# Patient Record
Sex: Female | Born: 1950 | Race: White | Hispanic: No | Marital: Married | Smoking: Never smoker
Health system: Southern US, Community
[De-identification: ages and names within clinical notes are randomized; demographics above are authoritative.]

## PROBLEM LIST (undated history)

## (undated) DIAGNOSIS — M8580 Other specified disorders of bone density and structure, unspecified site: Secondary | ICD-10-CM

## (undated) DIAGNOSIS — B009 Herpesviral infection, unspecified: Secondary | ICD-10-CM

## (undated) DIAGNOSIS — N952 Postmenopausal atrophic vaginitis: Secondary | ICD-10-CM

## (undated) HISTORY — DX: Other specified disorders of bone density and structure, unspecified site: M85.80

## (undated) HISTORY — DX: Postmenopausal atrophic vaginitis: N95.2

## (undated) HISTORY — DX: Herpesviral infection, unspecified: B00.9

---

## 1997-10-17 ENCOUNTER — Other Ambulatory Visit: Admission: RE | Admit: 1997-10-17 | Discharge: 1997-10-17 | Payer: Self-pay | Admitting: Obstetrics and Gynecology

## 1998-06-14 ENCOUNTER — Other Ambulatory Visit: Admission: RE | Admit: 1998-06-14 | Discharge: 1998-06-14 | Payer: Self-pay | Admitting: Obstetrics and Gynecology

## 1999-06-08 ENCOUNTER — Other Ambulatory Visit: Admission: RE | Admit: 1999-06-08 | Discharge: 1999-06-08 | Payer: Self-pay | Admitting: Obstetrics and Gynecology

## 1999-08-30 ENCOUNTER — Other Ambulatory Visit: Admission: RE | Admit: 1999-08-30 | Discharge: 1999-08-30 | Payer: Self-pay | Admitting: Obstetrics and Gynecology

## 1999-08-30 ENCOUNTER — Encounter (INDEPENDENT_AMBULATORY_CARE_PROVIDER_SITE_OTHER): Payer: Self-pay | Admitting: Specialist

## 2000-10-21 ENCOUNTER — Other Ambulatory Visit: Admission: RE | Admit: 2000-10-21 | Discharge: 2000-10-21 | Payer: Self-pay | Admitting: Obstetrics and Gynecology

## 2001-07-07 ENCOUNTER — Ambulatory Visit (HOSPITAL_COMMUNITY): Admission: RE | Admit: 2001-07-07 | Discharge: 2001-07-07 | Payer: Self-pay | Admitting: Gastroenterology

## 2001-11-26 ENCOUNTER — Other Ambulatory Visit: Admission: RE | Admit: 2001-11-26 | Discharge: 2001-11-26 | Payer: Self-pay | Admitting: Obstetrics and Gynecology

## 2002-12-03 ENCOUNTER — Other Ambulatory Visit: Admission: RE | Admit: 2002-12-03 | Discharge: 2002-12-03 | Payer: Self-pay | Admitting: Obstetrics and Gynecology

## 2003-12-05 ENCOUNTER — Other Ambulatory Visit: Admission: RE | Admit: 2003-12-05 | Discharge: 2003-12-05 | Payer: Self-pay | Admitting: Obstetrics and Gynecology

## 2005-09-03 ENCOUNTER — Other Ambulatory Visit: Admission: RE | Admit: 2005-09-03 | Discharge: 2005-09-03 | Payer: Self-pay | Admitting: Obstetrics and Gynecology

## 2005-11-29 ENCOUNTER — Emergency Department (HOSPITAL_COMMUNITY): Admission: EM | Admit: 2005-11-29 | Discharge: 2005-11-29 | Payer: Self-pay | Admitting: Emergency Medicine

## 2006-12-30 ENCOUNTER — Encounter: Admission: RE | Admit: 2006-12-30 | Discharge: 2006-12-30 | Payer: Self-pay | Admitting: Internal Medicine

## 2008-05-25 ENCOUNTER — Encounter: Admission: RE | Admit: 2008-05-25 | Discharge: 2008-05-25 | Payer: Self-pay | Admitting: Obstetrics and Gynecology

## 2009-05-31 ENCOUNTER — Encounter: Admission: RE | Admit: 2009-05-31 | Discharge: 2009-05-31 | Payer: Self-pay | Admitting: Internal Medicine

## 2009-08-24 ENCOUNTER — Encounter: Payer: Self-pay | Admitting: Internal Medicine

## 2009-08-30 ENCOUNTER — Encounter: Payer: Self-pay | Admitting: Internal Medicine

## 2009-09-01 ENCOUNTER — Encounter: Payer: Self-pay | Admitting: Internal Medicine

## 2009-09-18 DIAGNOSIS — Q211 Atrial septal defect: Secondary | ICD-10-CM | POA: Insufficient documentation

## 2009-09-18 DIAGNOSIS — I4949 Other premature depolarization: Secondary | ICD-10-CM | POA: Insufficient documentation

## 2009-09-21 ENCOUNTER — Telehealth: Payer: Self-pay | Admitting: Internal Medicine

## 2009-09-21 ENCOUNTER — Ambulatory Visit: Payer: Self-pay | Admitting: Internal Medicine

## 2010-07-03 ENCOUNTER — Telehealth (INDEPENDENT_AMBULATORY_CARE_PROVIDER_SITE_OTHER): Payer: Self-pay | Admitting: *Deleted

## 2010-08-30 NOTE — Assessment & Plan Note (Signed)
Summary: np6/ palps,pvc  / ? insurance. 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 Referred by Dr. Timothy Lasso for further evaluation of atrial septal aneurysm.  Going through menopause. Under a lot of stress severeal months ago  due to son with bipolar disorder. Began to have tachypalpitations. Started taking 5HTP (serotonin containing) and made palpitations much worse. Stopped and palpitations of much better. Does not drink caffeine.  Recent EKG showed PVCs. ECHO at [facility] normal LV function with atrial septal aneurysm. Thyroid test pending.  Very active. Exercising regularly without symptoms. No syncope or neuro deficits.  Preventive Screening-Counseling & Management  Alcohol-Tobacco     Smoking Status: never  Caffeine-Diet-Exercise     Does Patient Exercise: yes  Current Medications (verified): 1)  Multivitamins   Tabs (Multiple Vitamin) .Marland Kitchen.. 1 Table 3 Tims Per Day 2)  Hawthorn Berry .Marland Kitchen.. 1 Tab Two Times A Day 3)  Fish Oil   Oil (Fish Oil) .Marland Kitchen.. 1 Capsule Two Times A Day 4)  Magnesium 200 Mg Tabs (Magnesium) .... 2-3 Times Per Day 5)  Calcium .... 2 Tablets Three Times A Day 6)  Mk-7 .... Once Daily 7)  Aspirin 81 Mg Tbec (Aspirin) .... Take One Tablet By Mouth Daily  Allergies (verified): No Known Drug Allergies  Past History:  Past Medical History: 1. Atrial septal aneurysm 2. PVCs    Family History: Reviewed history and no changes required. no family history of premature CAD  Social History: Reviewed history and no changes required. Married  Tobacco Use - No.  Regular Exercise - yes Smoking Status:  never Does Patient Exercise:  yes  Review of Systems       As per HPI and past medical history; otherwise all systems negative.   Vital Signs:  Patient profile:   60 year old female Height:      63 inches Weight:       134 pounds BMI:     23.82 Pulse rate:   69 / minute BP sitting:   126 / 66  (left arm)  Vitals Entered By: Laurance Flatten CMA (September 21, 2009 8:30 AM)  Physical Exam  General:  Gen: thin well appearing. no resp difficulty. mild anxious HEENT: normal Neck: supple. no JVD. Carotids 2+ bilat; no bruits. No lymphadenopathy or thryomegaly appreciated. Cor: PMI nondisplaced. Regular rate & rhythm. No rubs, gallops, murmur. Lungs: clear Abdomen: soft, nontender, nondistended. Good bowel sounds. Extremities: no cyanosis, clubbing, rash, edema Neuro: alert & orientedx3, cranial nerves grossly intact. moves all 4 extremities w/o difficulty. affect pleasant    Impression & Recommendations:  Problem # 1:  Atrial septal aneurysm Reassured her that this is benign congenital condition and likely associated with PFO which is seen in about 30% of the population. Explained that there is a question of an association with embolic events and migraines but data was controversial and not strong to support this. Did discuss the possibility of repeating echo with bubble study to further evalaute but as it will not change management she did not feel this was necessary.  Problem # 2:  PREMATURE VENTRICULAR CONTRACTIONS (ICD-427.69) Reassured her that these were benign and likely associated with stress and her previous supplement. Continue to avoid caffeine. No further work-up at ths point.  Patient Instructions: 1)  Your physician recommends that you schedule a follow-up appointment in: 12 months

## 2010-08-30 NOTE — Progress Notes (Signed)
Summary: Records Request  Faxed OV, EKG & Echo to Jodie at [facility] ([phone]). Debby Freiberg  July 03, 2010 4:16 PM

## 2010-08-30 NOTE — Progress Notes (Signed)
Summary: want to saying it's okay to get massage  Phone Note Call from Patient Call back at Home Phone [phone]   Caller: Patient Summary of Call: Pt need a note so that she can get a massage. can be fax to [phone] Initial call taken by: Judie Grieve,  September 21, 2009 1:53 PM  Follow-up for Phone Call        left mess for pt that note was faxed Meredith Staggers, RN  September 21, 2009 3:34 PM

## 2010-08-30 NOTE — Letter (Signed)
Summary: [facility]   Imported By: Marylou Mccoy 11/10/2009 16:15:32  _____________________________________________________________________  External Attachment:    Type:   Image     Comment:   External Document

## 2010-08-30 NOTE — Letter (Signed)
Summary: [facility]   Imported By: Marylou Mccoy 11/10/2009 16:26:08  _____________________________________________________________________  External Attachment:    Type:   Image     Comment:   External Document

## 2010-08-30 NOTE — Letter (Signed)
Summary: Generic Letter  Architectural technologist, Main Office  [address]   [place], [place] [address]   Phone: [phone]  Fax: [phone]        September 21, 2009 MRN: [id number]    Rebecca Winters [address] [place], [place]  [address]    To Whom It May Concern:  Mrs Albany is cleared from a cardiac viewpoint to get a massage.  If you have any questions please give me a call.    Sincerely,       Arvilla Meres, MD  This letter has been electronically signed by your physician.

## 2011-01-09 ENCOUNTER — Other Ambulatory Visit: Payer: Self-pay | Admitting: Internal Medicine

## 2011-01-09 DIAGNOSIS — N281 Cyst of kidney, acquired: Secondary | ICD-10-CM

## 2011-01-16 ENCOUNTER — Other Ambulatory Visit: Payer: Self-pay

## 2011-11-26 ENCOUNTER — Encounter: Payer: Self-pay | Admitting: Obstetrics and Gynecology

## 2011-11-26 ENCOUNTER — Ambulatory Visit (INDEPENDENT_AMBULATORY_CARE_PROVIDER_SITE_OTHER): Payer: BC Managed Care – PPO | Admitting: Obstetrics and Gynecology

## 2011-11-26 VITALS — BP 100/70 | HR 74 | Ht 63.5 in

## 2011-11-26 DIAGNOSIS — R109 Unspecified abdominal pain: Secondary | ICD-10-CM

## 2011-11-26 DIAGNOSIS — R141 Gas pain: Secondary | ICD-10-CM

## 2011-11-26 DIAGNOSIS — R3915 Urgency of urination: Secondary | ICD-10-CM

## 2011-11-26 DIAGNOSIS — Z124 Encounter for screening for malignant neoplasm of cervix: Secondary | ICD-10-CM

## 2011-11-26 DIAGNOSIS — R140 Abdominal distension (gaseous): Secondary | ICD-10-CM

## 2011-11-26 DIAGNOSIS — Z01419 Encounter for gynecological examination (general) (routine) without abnormal findings: Secondary | ICD-10-CM

## 2011-11-26 NOTE — Progress Notes (Signed)
Regular Periods: no Mammogram: yes   Monthly Breast Ex.: no Exercise: yes qd  Tetanus < 10 years: yes Seatbelts: no  NI. Bladder Functn.: yes Abuse at home: no  Daily BM's: yes Stressful Work: no  Healthy Diet: yes Sigmoid-Colonoscopy: 2012 wnl  Calcium: yes Medical problems this year: pt c/o bowel not working normal, c/o urgency; pt states bladder not working like it use to. C/o gas   LAST PAP:05/02/10 nl  Contraception:pm Mammogram:  10/03/10 nl PCP:Dr. Creola Corn [facility]   PMH: no change  FMH:no change  BONE SCAN WITHIN 5 YEARS; OSTEOPENIA

## 2011-11-26 NOTE — Patient Instructions (Addendum)
Schedule follow up after pelvic ultrasound with Dr. Estanislado Pandy.

## 2011-11-26 NOTE — Progress Notes (Signed)
Subjective:    Rebecca Winters is a 61 y.o. female G2P0011 who presents for annual exam c/o increased bloating x 1 month with loose stools, urinary urgency and a sensation in the middle lower pelvic area ( constant).  Doesn't know of any aggravating or alleviating factors. Nocturia x 3.  Increased flatulence. Patient take numerous supplements to include OTC Progesterone.  The patient  History  Sexual Activity  . Sexually Active: Yes -- Female partner(s)  . Birth Control/ Protection: Post-menopausal  . The patient is taking hormone replacement therapy and is taking a Calcium supplement. Patient denies post-menopausal vaginal bleeding..  Last Pap: was normal  2011 Last mammogram: was normal  2012 Last DEXA scan : osteopenia Colonoscopy: normal 06/2011   Review of Systems Pertinent items are noted in HPI. Gastrointestinal: increased flatulence,   no abdominal pain, no rectal bleeding Genitourinary:negative for dysuria, frequency, hematuria, nocturia and urinary incontinence  but c/o urgency.   Objective:     BP 100/70  Pulse 74  Ht 5' 3.5" (1.613 m) Weight:  Wt Readings from Last 1 Encounters:  09/21/09 134 lb (60.782 kg)   BMI: There is no weight on file to calculate BMI. General Appearance: Alert, appropriate appearance for age. No acute distress HEENT: Grossly normal Neck / Thyroid: Supple, no masses, nodes or enlargement Lungs: clear to auscultation bilaterally Back: No CVA tenderness Breast Exam: No masses or nodes.No dimpling, nipple retraction or discharge. Cardiovascular: Regular rate and rhythm. S1, S2, no murmur Gastrointestinal: Soft, non-tender, no masses or organomegaly Pelvic Exam: EGBUS- mild atrophy, vagina-atrophic, cervix-no lesions, uterus-normal size/shape, adnexae-no masses or tenderness, minimal relaxation Rectovaginal: normal rectal, no masses Lymphatic Exam: Non-palpable nodes in neck, clavicular, axillary, or inguinal regions Skin: no rash or  abnormalities Extremities: No cyanosis, No clubbing and No edema  negative Homan's Neurologic: Grossly normal Psychiatric: Alert and oriented, appropriate affect.   U/A negative Assessment:    Routine GYN Exam Abdominal Bloating Urinary Urgency Minimal Pelvic Relaxation   Plan:   PAP smear sent  Reviewed revised guidelines for PAP smear maintenance schedule  Pelvic ultrasound to evaluate ovaries & for pelvic discomfort  Follow-up:  1 year   Kisha Messman,ELMIRAPA-C

## 2011-11-29 LAB — PAP IG W/ RFLX HPV ASCU

## 2011-12-12 ENCOUNTER — Encounter: Payer: BC Managed Care – PPO | Admitting: Obstetrics and Gynecology

## 2011-12-12 ENCOUNTER — Other Ambulatory Visit: Payer: BC Managed Care – PPO

## 2013-01-07 ENCOUNTER — Other Ambulatory Visit: Payer: Self-pay

## 2013-01-07 DIAGNOSIS — Z1231 Encounter for screening mammogram for malignant neoplasm of breast: Secondary | ICD-10-CM

## 2013-01-25 ENCOUNTER — Ambulatory Visit
Admission: RE | Admit: 2013-01-25 | Discharge: 2013-01-25 | Disposition: A | Discharge status: Home / Self Care | Payer: BC Managed Care – PPO | Source: Ambulatory Visit

## 2013-01-25 DIAGNOSIS — Z1231 Encounter for screening mammogram for malignant neoplasm of breast: Secondary | ICD-10-CM

## 2014-05-30 ENCOUNTER — Encounter: Payer: Self-pay | Admitting: Obstetrics and Gynecology

## 2015-11-01 DIAGNOSIS — M9901 Segmental and somatic dysfunction of cervical region: Secondary | ICD-10-CM | POA: Diagnosis not present

## 2015-11-01 DIAGNOSIS — M9902 Segmental and somatic dysfunction of thoracic region: Secondary | ICD-10-CM | POA: Diagnosis not present

## 2015-11-01 DIAGNOSIS — M531 Cervicobrachial syndrome: Secondary | ICD-10-CM | POA: Diagnosis not present

## 2015-11-01 DIAGNOSIS — M9903 Segmental and somatic dysfunction of lumbar region: Secondary | ICD-10-CM | POA: Diagnosis not present

## 2015-11-08 DIAGNOSIS — E559 Vitamin D deficiency, unspecified: Secondary | ICD-10-CM | POA: Diagnosis not present

## 2015-11-08 DIAGNOSIS — Z0000 Encounter for general adult medical examination without abnormal findings: Secondary | ICD-10-CM | POA: Diagnosis not present

## 2015-11-16 DIAGNOSIS — L570 Actinic keratosis: Secondary | ICD-10-CM | POA: Diagnosis not present

## 2015-11-16 DIAGNOSIS — Z0000 Encounter for general adult medical examination without abnormal findings: Secondary | ICD-10-CM | POA: Diagnosis not present

## 2015-11-16 DIAGNOSIS — K5900 Constipation, unspecified: Secondary | ICD-10-CM | POA: Diagnosis not present

## 2015-11-16 DIAGNOSIS — Z23 Encounter for immunization: Secondary | ICD-10-CM | POA: Diagnosis not present

## 2015-11-16 DIAGNOSIS — Z1389 Encounter for screening for other disorder: Secondary | ICD-10-CM | POA: Diagnosis not present

## 2015-11-24 ENCOUNTER — Other Ambulatory Visit (HOSPITAL_COMMUNITY): Payer: Self-pay

## 2015-11-27 ENCOUNTER — Other Ambulatory Visit: Payer: Self-pay

## 2015-11-27 ENCOUNTER — Ambulatory Visit (HOSPITAL_COMMUNITY): Payer: BLUE CROSS/BLUE SHIELD | Attending: Cardiovascular Disease

## 2015-11-27 ENCOUNTER — Other Ambulatory Visit: Payer: Self-pay | Admitting: Internal Medicine

## 2015-11-27 DIAGNOSIS — Q211 Atrial septal defect, unspecified: Secondary | ICD-10-CM

## 2015-11-27 DIAGNOSIS — I340 Nonrheumatic mitral (valve) insufficiency: Secondary | ICD-10-CM | POA: Diagnosis not present

## 2015-11-27 DIAGNOSIS — R002 Palpitations: Secondary | ICD-10-CM | POA: Diagnosis present

## 2015-12-04 DIAGNOSIS — G894 Chronic pain syndrome: Secondary | ICD-10-CM | POA: Diagnosis not present

## 2015-12-04 DIAGNOSIS — M47813 Spondylosis without myelopathy or radiculopathy, cervicothoracic region: Secondary | ICD-10-CM | POA: Diagnosis not present

## 2015-12-04 DIAGNOSIS — M542 Cervicalgia: Secondary | ICD-10-CM | POA: Diagnosis not present

## 2015-12-04 DIAGNOSIS — M6240 Contracture of muscle, unspecified site: Secondary | ICD-10-CM | POA: Diagnosis not present

## 2015-12-05 DIAGNOSIS — H5212 Myopia, left eye: Secondary | ICD-10-CM | POA: Diagnosis not present

## 2015-12-05 DIAGNOSIS — H5213 Myopia, bilateral: Secondary | ICD-10-CM | POA: Diagnosis not present

## 2015-12-05 DIAGNOSIS — H52222 Regular astigmatism, left eye: Secondary | ICD-10-CM | POA: Diagnosis not present

## 2015-12-05 DIAGNOSIS — H524 Presbyopia: Secondary | ICD-10-CM | POA: Diagnosis not present

## 2016-02-21 DIAGNOSIS — M531 Cervicobrachial syndrome: Secondary | ICD-10-CM | POA: Diagnosis not present

## 2016-02-21 DIAGNOSIS — M9903 Segmental and somatic dysfunction of lumbar region: Secondary | ICD-10-CM | POA: Diagnosis not present

## 2016-02-21 DIAGNOSIS — M9902 Segmental and somatic dysfunction of thoracic region: Secondary | ICD-10-CM | POA: Diagnosis not present

## 2016-02-21 DIAGNOSIS — M9901 Segmental and somatic dysfunction of cervical region: Secondary | ICD-10-CM | POA: Diagnosis not present

## 2016-03-13 DIAGNOSIS — M9903 Segmental and somatic dysfunction of lumbar region: Secondary | ICD-10-CM | POA: Diagnosis not present

## 2016-03-13 DIAGNOSIS — M5417 Radiculopathy, lumbosacral region: Secondary | ICD-10-CM | POA: Diagnosis not present

## 2016-03-13 DIAGNOSIS — M9901 Segmental and somatic dysfunction of cervical region: Secondary | ICD-10-CM | POA: Diagnosis not present

## 2016-03-13 DIAGNOSIS — M9902 Segmental and somatic dysfunction of thoracic region: Secondary | ICD-10-CM | POA: Diagnosis not present

## 2016-06-13 DIAGNOSIS — M9902 Segmental and somatic dysfunction of thoracic region: Secondary | ICD-10-CM | POA: Diagnosis not present

## 2016-06-13 DIAGNOSIS — M9903 Segmental and somatic dysfunction of lumbar region: Secondary | ICD-10-CM | POA: Diagnosis not present

## 2016-06-13 DIAGNOSIS — M531 Cervicobrachial syndrome: Secondary | ICD-10-CM | POA: Diagnosis not present

## 2016-06-13 DIAGNOSIS — M9901 Segmental and somatic dysfunction of cervical region: Secondary | ICD-10-CM | POA: Diagnosis not present

## 2016-06-25 DIAGNOSIS — M9902 Segmental and somatic dysfunction of thoracic region: Secondary | ICD-10-CM | POA: Diagnosis not present

## 2016-06-25 DIAGNOSIS — M791 Myalgia: Secondary | ICD-10-CM | POA: Diagnosis not present

## 2016-06-25 DIAGNOSIS — M4712 Other spondylosis with myelopathy, cervical region: Secondary | ICD-10-CM | POA: Diagnosis not present

## 2016-06-25 DIAGNOSIS — M9901 Segmental and somatic dysfunction of cervical region: Secondary | ICD-10-CM | POA: Diagnosis not present

## 2016-07-05 ENCOUNTER — Other Ambulatory Visit: Payer: Self-pay | Admitting: Obstetrics and Gynecology

## 2016-07-05 DIAGNOSIS — Z1231 Encounter for screening mammogram for malignant neoplasm of breast: Secondary | ICD-10-CM

## 2016-08-05 DIAGNOSIS — M9902 Segmental and somatic dysfunction of thoracic region: Secondary | ICD-10-CM | POA: Diagnosis not present

## 2016-08-05 DIAGNOSIS — M791 Myalgia: Secondary | ICD-10-CM | POA: Diagnosis not present

## 2016-08-05 DIAGNOSIS — M9901 Segmental and somatic dysfunction of cervical region: Secondary | ICD-10-CM | POA: Diagnosis not present

## 2016-08-05 DIAGNOSIS — M4712 Other spondylosis with myelopathy, cervical region: Secondary | ICD-10-CM | POA: Diagnosis not present

## 2016-08-08 DIAGNOSIS — Z23 Encounter for immunization: Secondary | ICD-10-CM | POA: Diagnosis not present

## 2016-08-13 DIAGNOSIS — M545 Low back pain: Secondary | ICD-10-CM | POA: Diagnosis not present

## 2016-08-13 DIAGNOSIS — M5416 Radiculopathy, lumbar region: Secondary | ICD-10-CM | POA: Diagnosis not present

## 2016-08-16 DIAGNOSIS — M545 Low back pain: Secondary | ICD-10-CM | POA: Diagnosis not present

## 2016-08-20 DIAGNOSIS — M545 Low back pain: Secondary | ICD-10-CM | POA: Diagnosis not present

## 2016-08-23 DIAGNOSIS — M533 Sacrococcygeal disorders, not elsewhere classified: Secondary | ICD-10-CM | POA: Diagnosis not present

## 2016-08-23 DIAGNOSIS — M47816 Spondylosis without myelopathy or radiculopathy, lumbar region: Secondary | ICD-10-CM | POA: Diagnosis not present

## 2016-08-27 DIAGNOSIS — M533 Sacrococcygeal disorders, not elsewhere classified: Secondary | ICD-10-CM | POA: Diagnosis not present

## 2016-10-09 ENCOUNTER — Ambulatory Visit: Payer: BLUE CROSS/BLUE SHIELD

## 2016-11-14 DIAGNOSIS — M859 Disorder of bone density and structure, unspecified: Secondary | ICD-10-CM | POA: Diagnosis not present

## 2016-11-14 DIAGNOSIS — Z0000 Encounter for general adult medical examination without abnormal findings: Secondary | ICD-10-CM | POA: Diagnosis not present

## 2016-11-21 DIAGNOSIS — M545 Low back pain: Secondary | ICD-10-CM | POA: Diagnosis not present

## 2016-11-21 DIAGNOSIS — Q211 Atrial septal defect: Secondary | ICD-10-CM | POA: Diagnosis not present

## 2016-11-21 DIAGNOSIS — Z0000 Encounter for general adult medical examination without abnormal findings: Secondary | ICD-10-CM | POA: Diagnosis not present

## 2016-11-21 DIAGNOSIS — E559 Vitamin D deficiency, unspecified: Secondary | ICD-10-CM | POA: Diagnosis not present

## 2016-11-21 DIAGNOSIS — M859 Disorder of bone density and structure, unspecified: Secondary | ICD-10-CM | POA: Diagnosis not present

## 2016-11-21 DIAGNOSIS — Z1389 Encounter for screening for other disorder: Secondary | ICD-10-CM | POA: Diagnosis not present

## 2016-11-29 DIAGNOSIS — Z1212 Encounter for screening for malignant neoplasm of rectum: Secondary | ICD-10-CM | POA: Diagnosis not present

## 2016-12-02 DIAGNOSIS — Z1231 Encounter for screening mammogram for malignant neoplasm of breast: Secondary | ICD-10-CM | POA: Diagnosis not present

## 2016-12-11 DIAGNOSIS — H52223 Regular astigmatism, bilateral: Secondary | ICD-10-CM | POA: Diagnosis not present

## 2016-12-11 DIAGNOSIS — H5213 Myopia, bilateral: Secondary | ICD-10-CM | POA: Diagnosis not present

## 2016-12-11 DIAGNOSIS — H5212 Myopia, left eye: Secondary | ICD-10-CM | POA: Diagnosis not present

## 2016-12-11 DIAGNOSIS — H524 Presbyopia: Secondary | ICD-10-CM | POA: Diagnosis not present

## 2017-01-16 DIAGNOSIS — Z411 Encounter for cosmetic surgery: Secondary | ICD-10-CM | POA: Diagnosis not present

## 2017-01-16 DIAGNOSIS — L570 Actinic keratosis: Secondary | ICD-10-CM | POA: Diagnosis not present

## 2017-01-16 DIAGNOSIS — D1800 Hemangioma unspecified site: Secondary | ICD-10-CM | POA: Diagnosis not present

## 2017-01-16 DIAGNOSIS — L821 Other seborrheic keratosis: Secondary | ICD-10-CM | POA: Diagnosis not present

## 2017-03-18 DIAGNOSIS — M791 Myalgia: Secondary | ICD-10-CM | POA: Diagnosis not present

## 2017-03-18 DIAGNOSIS — M9902 Segmental and somatic dysfunction of thoracic region: Secondary | ICD-10-CM | POA: Diagnosis not present

## 2017-03-18 DIAGNOSIS — M4712 Other spondylosis with myelopathy, cervical region: Secondary | ICD-10-CM | POA: Diagnosis not present

## 2017-03-18 DIAGNOSIS — M9901 Segmental and somatic dysfunction of cervical region: Secondary | ICD-10-CM | POA: Diagnosis not present

## 2017-04-19 DIAGNOSIS — L255 Unspecified contact dermatitis due to plants, except food: Secondary | ICD-10-CM | POA: Diagnosis not present

## 2017-04-21 DIAGNOSIS — L255 Unspecified contact dermatitis due to plants, except food: Secondary | ICD-10-CM | POA: Diagnosis not present

## 2017-04-29 DIAGNOSIS — M9902 Segmental and somatic dysfunction of thoracic region: Secondary | ICD-10-CM | POA: Diagnosis not present

## 2017-04-29 DIAGNOSIS — M4712 Other spondylosis with myelopathy, cervical region: Secondary | ICD-10-CM | POA: Diagnosis not present

## 2017-04-29 DIAGNOSIS — M9901 Segmental and somatic dysfunction of cervical region: Secondary | ICD-10-CM | POA: Diagnosis not present

## 2017-04-29 DIAGNOSIS — M791 Myalgia, unspecified site: Secondary | ICD-10-CM | POA: Diagnosis not present

## 2017-05-09 DIAGNOSIS — Z6823 Body mass index (BMI) 23.0-23.9, adult: Secondary | ICD-10-CM | POA: Diagnosis not present

## 2017-05-09 DIAGNOSIS — H9193 Unspecified hearing loss, bilateral: Secondary | ICD-10-CM | POA: Diagnosis not present

## 2017-05-09 DIAGNOSIS — H6123 Impacted cerumen, bilateral: Secondary | ICD-10-CM | POA: Diagnosis not present

## 2017-06-02 DIAGNOSIS — M859 Disorder of bone density and structure, unspecified: Secondary | ICD-10-CM | POA: Diagnosis not present

## 2017-06-11 DIAGNOSIS — D4981 Neoplasm of unspecified behavior of retina and choroid: Secondary | ICD-10-CM | POA: Diagnosis not present

## 2017-06-11 DIAGNOSIS — D3131 Benign neoplasm of right choroid: Secondary | ICD-10-CM | POA: Diagnosis not present

## 2017-06-11 DIAGNOSIS — H2513 Age-related nuclear cataract, bilateral: Secondary | ICD-10-CM | POA: Diagnosis not present

## 2017-06-11 DIAGNOSIS — H16223 Keratoconjunctivitis sicca, not specified as Sjogren's, bilateral: Secondary | ICD-10-CM | POA: Diagnosis not present

## 2017-06-30 ENCOUNTER — Ambulatory Visit (INDEPENDENT_AMBULATORY_CARE_PROVIDER_SITE_OTHER): Payer: BLUE CROSS/BLUE SHIELD

## 2017-06-30 ENCOUNTER — Encounter: Payer: Self-pay | Admitting: Sports Medicine

## 2017-06-30 ENCOUNTER — Ambulatory Visit: Payer: Self-pay

## 2017-06-30 ENCOUNTER — Ambulatory Visit: Payer: BLUE CROSS/BLUE SHIELD | Admitting: Sports Medicine

## 2017-06-30 VITALS — BP 110/70 | HR 76 | Ht 63.0 in | Wt 135.6 lb

## 2017-06-30 DIAGNOSIS — M25551 Pain in right hip: Secondary | ICD-10-CM | POA: Diagnosis not present

## 2017-06-30 DIAGNOSIS — M810 Age-related osteoporosis without current pathological fracture: Secondary | ICD-10-CM

## 2017-06-30 NOTE — Patient Instructions (Addendum)
FAI   You had an injection today.  Things to be aware of after injection are listed below: . You may experience no significant improvement or even a slight worsening in your symptoms during the first 24 to 48 hours.  After that we expect your symptoms to improve gradually over the next 2 weeks for the medicine to have its maximal effect.  You should continue to have improvement out to 6 weeks after your injection. . Dr. Berline Choughigby recommends icing the site of the injection for 20 minutes  1-2 times the day of your injection . You may shower but no swimming, tub bath or Jacuzzi for 24 hours. . If your bandage falls off this does not need to be replaced.  It is appropriate to remove the bandage after 4 hours. . You may resume light activities as tolerated unless otherwise directed per Dr. Berline Choughigby during your visit  POSSIBLE STEROID SIDE EFFECTS:  Side effects from injectable steroids tend to be less than when taken orally however you may experience some of the symptoms listed below.  If experienced these should only last for a short period of time. Change in menstrual flow  Edema (swelling)  Increased appetite Skin flushing (redness)  Skin rash/acne  Thrush (oral) Yeast vaginitis    Increased sweating  Depression Increased blood glucose levels Cramping and leg/calf  Euphoria (feeling happy)  POSSIBLE PROCEDURE SIDE EFFECTS: The side effects of the injection are usually fairly minimal however if you may experience some of the following side effects that are usually self-limited and will is off on their own.  If you are concerned please feel free to call the office with questions:  Increased numbness or tingling  Nausea or vomiting  Swelling or bruising at the injection site   Please call our office if if you experience any of the following symptoms over the next week as these can be signs of infection:   Fever greater than 100.44F  Significant swelling at the injection site  Significant redness or  drainage from the injection site  If after 2 weeks you are continuing to have worsening symptoms please call our office to discuss what the next appropriate actions should be including the potential for a return office visit or other diagnostic testing.

## 2017-06-30 NOTE — Assessment & Plan Note (Signed)
Symptoms are consistent with FAI and likely underlying labral tear.  We will go ahead and inject her hip today to see how she responds to this.  She did have a small effusion on the ultrasound which does help confirm this is likely coming from an intra-articular source.  Given the prominent overlap of the acetabulum I do think that her symptoms will be radiographically occult on plain film x-rays but likely shows moderate degree of degenerative fraying of the labrum and likely further advanced changes of the femoral acetabular joint surface and what x-rays would delineate.  If any lack of improvement will need further diagnostic evaluation but we need arthrogram for this and ultimately she will likely not be a candidate for a arthroscopic intervention.  She should avoid any significant deep squats or terminal range of motion with her hips.  She should continue active strengthening and stretching though on a regular basis.  Can also consider systemic NSAIDs as she is not interested in this at this time.

## 2017-06-30 NOTE — Assessment & Plan Note (Addendum)
Discussed appropriate treatment for her osteoporosis that is recently been diagnosed.  Weightbearing activity including hopping is recommended in addition to pharmacotherapy as discussed with her PCP. Also discussed optimizing vitamin D and calcium intake.

## 2017-06-30 NOTE — Procedures (Signed)
PROCEDURE NOTE -  ULTRASOUND GUIDEDInjection: Right hip, intra-articular Images were obtained and interpreted by myself, Gaspar BiddingMichael Lexus Barletta, DO  Images have been saved and stored to PACS system. Images obtained on: GE S7 Ultrasound machine  ULTRASOUND FINDINGS:  Degenerative spurring of the anterior lip of the acetabulum with a marked amount of swelling around the labrum without overt tearing but limited due to nature of an ultrasound technology.  She did have a small effusion   DESCRIPTION OF PROCEDURE:  The patient's clinical condition is marked by substantial pain and/or significant functional disability. Other conservative therapy has not provided relief, is contraindicated, or not appropriate. There is a reasonable likelihood that injection will significantly improve the patient's pain and/or functional impairment.  After discussing the risks, benefits and expected outcomes of the injection and all questions were reviewed and answered, the patient wished to undergo the above named procedure. Verbal consent was obtained.  The ultrasound was used to identify the target structure and adjacent neurovascular structures. The skin was then prepped in sterile fashion and the target structure was injected under direct visualization using sterile technique as below:  Right PREP: Alcohol, Ethel Chloride,   APPROACH: direct, stopcock technique, 22g 3.5in. INJECTATE: 5cc 1% lidocaine, 2cc 0.5% marcaine, 2cc 40mg /mL DepoMedrol  ASPIRATE: N/A DRESSING: Band-Aid    Post procedural instructions including recommending icing and warning signs for infection were reviewed.  This procedure was well tolerated and there were no complications.   IMPRESSION: Succesful US Guided Injection

## 2017-06-30 NOTE — Progress Notes (Signed)
Veverly FellsMichael D. Delorise Shinerigby, DO  [facility] [phone]  Rebecca PealsJudi Scarbro - 66 y.o. female MRN [id number]  Date of birth: 09/15/1950   Scribe for today's visit: Christoper FabianMolly Weber, ATC    SUBJECTIVE:  Rebecca Winters is here for New Patient (Initial Visit) (Hip pain)  Her R hip pain symptoms INITIALLY: Began about 5-6 months w/ no MOI. Described as no pain at rest but can increase to an 8/10, radiating to R upper thigh and groin. Worsened with prolonged sitting and prolonged rest and certain yoga poses. Improved with movement Additional associated symptoms include: no shooting pain or N/T into the R LE    At this time symptoms are worsening compared to onset w/ the pain being more constant. She has been doing glute strengthening, yoga, massage and taking white willow bark.  Also noticing pain in her R SIJ, noting that it feels like it "seizes up".  Diagnosed w/ osteoporsis w/i the past 2-3 weeks.   ROS Denies night time disturbances. Denies fevers, chills, or night sweats. Denies unexplained weight loss. Denies personal history of cancer. Reports changes in bowel or bladder habits (increased night time urination, approximately every 2-3 hours) Denies recent unreported falls. Denies new or worsening dyspnea or wheezing. Denies headaches or dizziness.  Denies numbness, tingling or weakness  In the extremities.  Denies dizziness or presyncopal episodes Denies lower extremity edema    HISTORY & PERTINENT PRIOR DATA:  Prior History reviewed and updated per electronic medical record. Significant history, findings, studies and interim changes include: No additional findings.  reports that  has never smoked. she has never used smokeless tobacco. No results for input(s): HGBA1C, LABURIC, CREATINE in the last 8760 hours. Problem  Osteoporosis   2.7 - lumbar spine -    Right Hip Pain     OBJECTIVE:  VS:  HT:5\' 3"  (160 cm)   WT:135 lb 9.6 oz (61.5 kg)   BMI:24.03    BP:110/70  HR:76bpm  TEMP: ( )  RESP:97 %  PHYSICAL EXAM: Constitutional: WDWN, Non-toxic appearing. Psychiatric: Alert & appropriately interactive. Not depressed or anxious appearing. Respiratory: No increased work of breathing. Trachea Midline Eyes: Pupils are equal. EOM intact without nystagmus. No scleral icterus  LOWER EXTREMITIES: No clubbing or cyanosis appreciated No significant venous stasis changes No calf tenderness, negative Homan's sign, no calf cords Generalized/Pre-tibial edema: none Pedal Pulses: Normal & symmetrically palpable  Sensation in LE dermatomes: intact to light touch   Right hip: Overall well aligned.  She has approximately 30 degrees of increased external rotation on the right compared to the left.  Limited internal rotation with FADIR testing slightly worse on the right than the left.  Mild pain with logroll but this is minimal.  Minimal pain with axial load and circumduction.  She does have pain that localizes with this however to the buttock and groin.  Positive Stinchfield  ASSESSMENT & PLAN:   1. Right hip pain   2. Age-related osteoporosis without current pathological fracture    Plan:     Right hip pain Symptoms are consistent with FAI and likely underlying labral tear.  We will go ahead and inject her hip today to see how she responds to this.  She did have a small effusion on the ultrasound which does help confirm this is likely coming from an intra-articular source.  Given the prominent overlap of the acetabulum I do think that her symptoms will be radiographically occult on plain film x-rays  but likely shows moderate degree of degenerative fraying of the labrum and likely further advanced changes of the femoral acetabular joint surface and what x-rays would delineate.  If any lack of improvement will need further diagnostic evaluation but we need arthrogram for this and ultimately she will likely not be a candidate for a  arthroscopic intervention.  She should avoid any significant deep squats or terminal range of motion with her hips.  She should continue active strengthening and stretching though on a regular basis.  Can also consider systemic NSAIDs as she is not interested in this at this time.  Osteoporosis Discussed appropriate treatment for her osteoporosis that is recently been diagnosed.  Weightbearing activity including hopping is recommended in addition to pharmacotherapy as discussed with her PCP. Also discussed optimizing vitamin D and calcium intake.   ++++++++++++++++++++++++++++++++++++++++++++ Orders:  Orders Placed This Encounter  Procedures  . DG HIP UNILAT W OR W/O PELVIS 2-3 VIEWS RIGHT  . US LIMITED JOINT SPACE STRUCTURES LOW RIGHT(NO LINKED CHARGES)    Meds:  No orders of the defined types were placed in this encounter.   ++++++++++++++++++++++++++++++++++++++++++++ Follow-up: Return in about 6 weeks (around 08/11/2017).   Pertinent documentation may be included in additional procedure notes, imaging studies, problem based documentation and patient instructions. Please see these sections of the encounter for additional information regarding this visit. CMA/ATC served as Neurosurgeonscribe during this visit. History, Physical, and Plan performed by medical provider. Documentation and orders reviewed and attested to.      Andrena MewsMichael D Canyon Lohr, DO    [facility]

## 2017-07-30 ENCOUNTER — Telehealth: Payer: Self-pay | Admitting: Sports Medicine

## 2017-07-30 NOTE — Telephone Encounter (Signed)
Copied from CRM [phone]. Topic: General - Other >> Jul 30, 2017  2:51 PM Lelon FrohlichGolden, Jazlynne Milliner, RMA wrote: Reason for CRM: Pt would like a call back concerning her rt hip

## 2017-07-30 NOTE — Telephone Encounter (Signed)
Called pt and left VM to call the office back (need to know more about what is going on with her RT hip).  

## 2017-07-30 NOTE — Telephone Encounter (Signed)
Copied from CRM #[id number]. Topic: General - Other >> Jul 30, 2017  2:51 PM Golden, Rebecca Winters, RMA wrote: Reason for CRM: Pt would like a call back concerning her rt hip  

## 2017-08-01 NOTE — Telephone Encounter (Signed)
Called pt and left VM to call the office back (need to know more about what is going on with her RT hip).

## 2017-08-05 NOTE — Telephone Encounter (Signed)
Unable to reach pt by phone, letter mailed.

## 2017-08-11 ENCOUNTER — Ambulatory Visit: Payer: BLUE CROSS/BLUE SHIELD | Admitting: Sports Medicine

## 2017-08-26 DIAGNOSIS — H16223 Keratoconjunctivitis sicca, not specified as Sjogren's, bilateral: Secondary | ICD-10-CM | POA: Diagnosis not present

## 2017-08-26 DIAGNOSIS — D4981 Neoplasm of unspecified behavior of retina and choroid: Secondary | ICD-10-CM | POA: Diagnosis not present

## 2017-08-26 DIAGNOSIS — H01004 Unspecified blepharitis left upper eyelid: Secondary | ICD-10-CM | POA: Diagnosis not present

## 2017-09-08 DIAGNOSIS — H01004 Unspecified blepharitis left upper eyelid: Secondary | ICD-10-CM | POA: Diagnosis not present

## 2017-09-08 DIAGNOSIS — H01005 Unspecified blepharitis left lower eyelid: Secondary | ICD-10-CM | POA: Diagnosis not present

## 2017-09-08 DIAGNOSIS — H16223 Keratoconjunctivitis sicca, not specified as Sjogren's, bilateral: Secondary | ICD-10-CM | POA: Diagnosis not present

## 2017-09-08 DIAGNOSIS — H2513 Age-related nuclear cataract, bilateral: Secondary | ICD-10-CM | POA: Diagnosis not present

## 2017-11-20 DIAGNOSIS — Z0000 Encounter for general adult medical examination without abnormal findings: Secondary | ICD-10-CM | POA: Diagnosis not present

## 2017-11-20 DIAGNOSIS — M859 Disorder of bone density and structure, unspecified: Secondary | ICD-10-CM | POA: Diagnosis not present

## 2017-11-27 DIAGNOSIS — Z1389 Encounter for screening for other disorder: Secondary | ICD-10-CM | POA: Diagnosis not present

## 2017-11-27 DIAGNOSIS — M25551 Pain in right hip: Secondary | ICD-10-CM | POA: Diagnosis not present

## 2017-11-27 DIAGNOSIS — H04129 Dry eye syndrome of unspecified lacrimal gland: Secondary | ICD-10-CM | POA: Diagnosis not present

## 2017-11-27 DIAGNOSIS — Z0000 Encounter for general adult medical examination without abnormal findings: Secondary | ICD-10-CM | POA: Diagnosis not present

## 2017-11-27 DIAGNOSIS — Q211 Atrial septal defect: Secondary | ICD-10-CM | POA: Diagnosis not present

## 2017-11-27 DIAGNOSIS — M810 Age-related osteoporosis without current pathological fracture: Secondary | ICD-10-CM | POA: Diagnosis not present

## 2017-11-28 DIAGNOSIS — Z1212 Encounter for screening for malignant neoplasm of rectum: Secondary | ICD-10-CM | POA: Diagnosis not present

## 2017-12-09 DIAGNOSIS — H2513 Age-related nuclear cataract, bilateral: Secondary | ICD-10-CM | POA: Diagnosis not present

## 2017-12-09 DIAGNOSIS — H16223 Keratoconjunctivitis sicca, not specified as Sjogren's, bilateral: Secondary | ICD-10-CM | POA: Diagnosis not present

## 2017-12-09 DIAGNOSIS — D3131 Benign neoplasm of right choroid: Secondary | ICD-10-CM | POA: Diagnosis not present

## 2017-12-09 DIAGNOSIS — H5213 Myopia, bilateral: Secondary | ICD-10-CM | POA: Diagnosis not present

## 2017-12-09 DIAGNOSIS — D4981 Neoplasm of unspecified behavior of retina and choroid: Secondary | ICD-10-CM | POA: Diagnosis not present

## 2018-03-05 DIAGNOSIS — M9901 Segmental and somatic dysfunction of cervical region: Secondary | ICD-10-CM | POA: Diagnosis not present

## 2018-03-05 DIAGNOSIS — M4712 Other spondylosis with myelopathy, cervical region: Secondary | ICD-10-CM | POA: Diagnosis not present

## 2018-03-05 DIAGNOSIS — M791 Myalgia, unspecified site: Secondary | ICD-10-CM | POA: Diagnosis not present

## 2018-03-05 DIAGNOSIS — M9902 Segmental and somatic dysfunction of thoracic region: Secondary | ICD-10-CM | POA: Diagnosis not present

## 2018-03-18 NOTE — Progress Notes (Signed)
The patient did not keep her appointment for mammogram. Dr. Stefano GaulStringer

## 2018-03-23 ENCOUNTER — Ambulatory Visit: Payer: BLUE CROSS/BLUE SHIELD | Admitting: Sports Medicine

## 2018-03-23 ENCOUNTER — Ambulatory Visit: Payer: Self-pay

## 2018-03-23 ENCOUNTER — Encounter: Payer: Self-pay | Admitting: Sports Medicine

## 2018-03-23 VITALS — BP 102/64 | HR 68 | Ht 63.0 in | Wt 139.2 lb

## 2018-03-23 DIAGNOSIS — M9905 Segmental and somatic dysfunction of pelvic region: Secondary | ICD-10-CM | POA: Diagnosis not present

## 2018-03-23 DIAGNOSIS — M9906 Segmental and somatic dysfunction of lower extremity: Secondary | ICD-10-CM

## 2018-03-23 DIAGNOSIS — M9903 Segmental and somatic dysfunction of lumbar region: Secondary | ICD-10-CM

## 2018-03-23 DIAGNOSIS — M24551 Contracture, right hip: Secondary | ICD-10-CM | POA: Diagnosis not present

## 2018-03-23 DIAGNOSIS — M25511 Pain in right shoulder: Secondary | ICD-10-CM | POA: Diagnosis not present

## 2018-03-23 DIAGNOSIS — G8929 Other chronic pain: Secondary | ICD-10-CM

## 2018-03-23 NOTE — Progress Notes (Signed)
PROCEDURE NOTE : OSTEOPATHIC MANIPULATION The decision today to treat with Osteopathic Manipulative Therapy (OMT) was based on physical exam findings. Verbal consent was obtained following a discussion with the patient regarding the of risks, benefits and potential side effects, including an acute pain flare,post manipulation soreness and need for repeat treatments.     Contraindications to OMT: NONE  Manipulation was performed as below: Regions treated: Lumbar spine, Pelvis and Lower extremities OMT Techniques Used: HVLA, muscle energy, myofascial release, articulatory and facilitated positional release  The patient tolerated the treatment well and reported Improved symptoms following treatment today. Patient was given medications, exercises, stretches and lifestyle modifications per AVS and verbally.   OSTEOPATHIC/STRUCTURAL EXAM:   L2 FRS left (Flexed, Rotated & Sidebent) Left psoas spasm Left anterior innonimate Externally rotated right hip joint

## 2018-03-23 NOTE — Patient Instructions (Addendum)
You had an injection today.  Things to be aware of after injection are listed below: . You may experience no significant improvement or even a slight worsening in your symptoms during the first 24 to 48 hours.  After that we expect your symptoms to improve gradually over the next 2 weeks for the medicine to have its maximal effect.  You should continue to have improvement out to 6 weeks after your injection. . Dr. Berline Choughigby recommends icing the site of the injection for 20 minutes  1-2 times the day of your injection . You may shower but no swimming, tub bath or Jacuzzi for 24 hours. . If your bandage falls off this does not need to be replaced.  It is appropriate to remove the bandage after 4 hours. . You may resume light activities as tolerated unless otherwise directed per Dr. Berline Choughigby during your visit  POSSIBLE STEROID SIDE EFFECTS:  Side effects from injectable steroids tend to be less than when taken orally however you may experience some of the symptoms listed below.  If experienced these should only last for a short period of time. Change in menstrual flow  Edema (swelling)  Increased appetite Skin flushing (redness)  Skin rash/acne  Thrush (oral) Yeast vaginitis    Increased sweating  Depression Increased blood glucose levels Cramping and leg/calf  Euphoria (feeling happy)  POSSIBLE PROCEDURE SIDE EFFECTS: The side effects of the injection are usually fairly minimal however if you may experience some of the following side effects that are usually self-limited and will is off on their own.  If you are concerned please feel free to call the office with questions:  Increased numbness or tingling  Nausea or vomiting  Swelling or bruising at the injection site   Please call our office if if you experience any of the following symptoms over the next week as these can be signs of infection:   Fever greater than 100.28F  Significant swelling at the injection site  Significant redness or drainage  from the injection site  If after 2 weeks you are continuing to have worsening symptoms please call our office to discuss what the next appropriate actions should be including the potential for a return office visit or other diagnostic testing.    Please perform the exercise program that we have prepared for you and gone over in detail on a daily basis.  In addition to the handout you were provided you can access your program through: [email or web address]   Your unique program code is:  [email or web address]    Also check out [employer]"Foundation Training" which is a program developed by Dr. Myles LippsEric Goodman.   There are links to a couple of his YouTube Videos below and I would like to see performing one of his videos 5-6 days per week.    A good intro video is: "Independence from Pain 7-minute Video" - [email or web address]   Exercises that focus more on the neck are as below: Dr. Derrill KayGoodman with [employer] [employer] teaching neck and shoulder details Part 1 - https://[email or web address] Part 2 Dr. Derrill KayGoodman with [facility] quick routine to practice daily - [email or web address]  Do not try to attempt the entire video when first beginning.    Try breaking of each exercise that he goes into shorter segments.  Otherwise if they perform an exercise for 45 seconds, start with 15 seconds and rest and then resume when they begin the new activity.  If you work your way up to being able to do these  videos without having to stop, I expect you will see significant improvements in your pain.  If you enjoy his videos and would like to find out more you can look on his website: [email or web address].  He has a workout streaming option as well as a DVD set available for purchase.  Amazon has the best price for his DVDs.

## 2018-03-23 NOTE — Progress Notes (Signed)
PROCEDURE NOTE: THERAPEUTIC EXERCISES (97110) 15 minutes spent for Therapeutic exercises as below and as referenced in the AVS.  This included exercises focusing on stretching, strengthening, with significant focus on eccentric aspects.   Proper technique shown and discussed handout in great detail with ATC.  All questions were discussed and answered.   Long term goals include an improvement in range of motion, strength, endurance as well as avoiding reinjury. Frequency of visits is one time as determined during today's  office visit. Frequency of exercises to be performed is as per handout.  EXERCISES REVIEWED:  Derrill KayGoodman Exercises  Thoracic rotation stretching

## 2018-03-23 NOTE — Procedures (Signed)
PROCEDURE NOTE:  Ultrasound Guided: Injection: Right shoulder Images were obtained and interpreted by myself, Gaspar BiddingMichael Tyrik Stetzer, DO  Images have been saved and stored to PACS system. Images obtained on: GE S7 Ultrasound machine    ULTRASOUND FINDINGS:  Small joint effusion, slight fraying of the posterior labrum  DESCRIPTION OF PROCEDURE:  The patient's clinical condition is marked by substantial pain and/or significant functional disability. Other conservative therapy has not provided relief, is contraindicated, or not appropriate. There is a reasonable likelihood that injection will significantly improve the patient's pain and/or functional impairment.   After discussing the risks, benefits and expected outcomes of the injection and all questions were reviewed and answered, the patient wished to undergo the above named procedure.  Verbal consent was obtained.  The ultrasound was used to identify the target structure and adjacent neurovascular structures. The skin was then prepped in sterile fashion and the target structure was injected under direct visualization using sterile technique as below:  Single injection performed as below: PREP: Alcohol and Ethel Chloride APPROACH:posterior, single injection, 21g 2 in. INJECTATE: 2 cc 0.5% Marcaine and 2 cc 40mg /mL DepoMedrol ASPIRATE: None DRESSING: Band-Aid  Post procedural instructions including recommending icing and warning signs for infection were reviewed.    This procedure was well tolerated and there were no complications.   IMPRESSION: Succesful Ultrasound Guided: Injection

## 2018-03-23 NOTE — Progress Notes (Signed)
Veverly FellsMichael D. Delorise Shinerigby, DO   Sports Medicine [facility] [place] [phone]  Rebecca PealsJudi Winters - 67 y.o. female MRN [id number]  Date of birth: 04/02/1951  Visit Date: 03/23/2018  PCP: Creola Cornusso, John, MD   Referred by: Creola Cornusso, John, MD  Scribe(s) for today's visit: Christoper FabianMolly Weber, LAT, ATC  SUBJECTIVE:  Rebecca PealsJudi Winters is here for Initial Assessment (R shoulder pain) .    Her R shoulder (axilla and post shld) pain symptoms INITIALLY: Began about 3-4 months ago w/ no known MOI  She notes that she does pilates and feels that it may be associated w/ some of those exercises.  R shoulder aBd is limited. Described as mild aching pain, nonradiating Worsened with various R arm positions but nothing specific Improved with soft tissue mobilization - sees Dr. Sherryle Lisodulfo Additional associated symptoms include: no mechanical symptoms, no swelling and no N/T noted in the R shoulder or UE    At this time symptoms show no change compared to onset. She has been seeing Dr. Sherryle Lisodulfo intermittently and going to yoga and Pilates.  L lower back pain / QL Her L lower back / QL symptoms INITIALLY: Began a while ago w/ no known MOI.  She states that when she lays on her L side, she has pain.  She notes that  it does not prevent her from doing anything and is more of a nagging/annoying pain. Described as mild nagging pain, radiating to the L glute and L groin. Worsened with prolonged sitting and laying on her L side Improved with Yoga and pilates / movement Additional associated symptoms include: no N/T noted    At this time symptoms show no change compared to onset  She has been doing Yoga and Pilates.   REVIEW OF SYSTEMS: Reports night time disturbances. Denies fevers, chills, or night sweats. Denies unexplained weight loss. Denies personal history of cancer. Denies changes in bowel or bladder habits. Denies recent unreported falls. Denies new or worsening dyspnea or wheezing. Denies headaches or  dizziness.  Denies numbness, tingling or weakness  In the extremities.  Denies dizziness or presyncopal episodes Denies lower extremity edema     HISTORY & PERTINENT PRIOR DATA:  Significant/pertinent history, findings, studies include:  reports that she has never smoked. She has never used smokeless tobacco. No results for input(s): HGBA1C, LABURIC, CREATINE in the last 8760 hours. No specialty comments available. No problems updated.  Otherwise prior history reviewed and updated per electronic medical record.    OBJECTIVE:  VS:  HT:5\' 3"  (160 cm)   WT:139 lb 3.2 oz (63.1 kg)  BMI:24.66    BP:102/64  HR:68bpm  TEMP: ( )  RESP:96 %   PHYSICAL EXAM: CONSTITUTIONAL: Well-developed, Well-nourished and In no acute distress Alert & appropriately interactive. and Not depressed or anxious appearing. RESPIRATORY: No increased work of breathing and Trachea Midline EYES: Pupils are equal., EOM intact without nystagmus. and No scleral icterus.  Upper and Lower extremities: Warm and well perfused NEURO: unremarkable  MSK Exam: Right Shoulder Exam: Well aligned, no significant deformity. No overlying skin changes. Neck: normal range of motion and supple Non tender to palpation over: Bony Landmarks TTP over: none Axial loading produces: Mild pain and Mild crepitation Drop arm test: negative  Internal Rotation:  ROM: Normal with no pain.   Strength: Normal External Rotation:  ROM: Normal with no pain.   Strength: Normal  Hawkins: positive, mild pain Neers: positive, mild pain  Empty Can: normal, no pain Strength: Normal Speed's:  positive, mild pain Strength: 4/5 O'Briens: positive, mild pain Strength: 4/5  Right HIP Exam: Well aligned, no significant deformity. No overlying skin changes. No focal bony tenderness   Log Roll: normal, no pain FADIR: positive, mild pain FABER: normal, no pain Stinchfield testing: positive, mild pain Strength: 5/5 Axial loading produces:  No pain and No crepitation    PROCEDURES & DATA REVIEWED:  . Osteopathic manipulation was performed today based on physical exam findings.  Please see procedure note for further information including Osteopathic Exam findings . Discussed the foundation of treatment for this condition is physical therapy and/or daily (5-6 days/week) therapeutic exercises, focusing on core strengthening, coordination, neuromuscular control/reeducation.  Therapeutic exercises prescribed per procedure note. . US Guided Injection per procedure note  ASSESSMENT   1. Chronic right shoulder pain   2. Right hip flexor tightness   3. Somatic dysfunction of lower extremity   4. Somatic dysfunction of pelvis region   5. Somatic dysfunction of lumbar region     PLAN:       . Right shoulder pain consistent with likely labral tear versus mild degenerative change.  Intra-articular injection performed today. . Therapeutic exercises and osteopathic manipulation performed to the lower extremity. . Please see procedure section and notes. . If any lack of improvement consider further diagnostic evaluation with Advanced diagnostic testing. No problem-specific Assessment & Plan notes found for this encounter.  Follow-up: Return in about 6 weeks (around 05/04/2018).      Please see additional documentation for Objective, Assessment and Plan sections. Pertinent additional documentation may be included in corresponding procedure notes, imaging studies, problem based documentation and patient instructions. Please see these sections of the encounter for additional information regarding this visit.  CMA/ATC served as Neurosurgeon during this visit. History, Physical, and Plan performed by medical provider. Documentation and orders reviewed and attested to.      Andrena Mews, DO    [employer] Physician

## 2018-05-05 ENCOUNTER — Ambulatory Visit: Payer: BLUE CROSS/BLUE SHIELD | Admitting: Sports Medicine

## 2018-05-21 IMAGING — DX DG HIP (WITH OR WITHOUT PELVIS) 2-3V*R*
2 series · 2 of 2 positions shown · non-contrast
Comparison: None in PACs

CLINICAL DATA: Dull right hip and groin pain radiating into the
right buttock and right thigh for the past 5 DIS 6 months. No known
injury.

EXAM:
DG HIP (WITH OR WITHOUT PELVIS) 2-3V RIGHT

[pelvis ap]
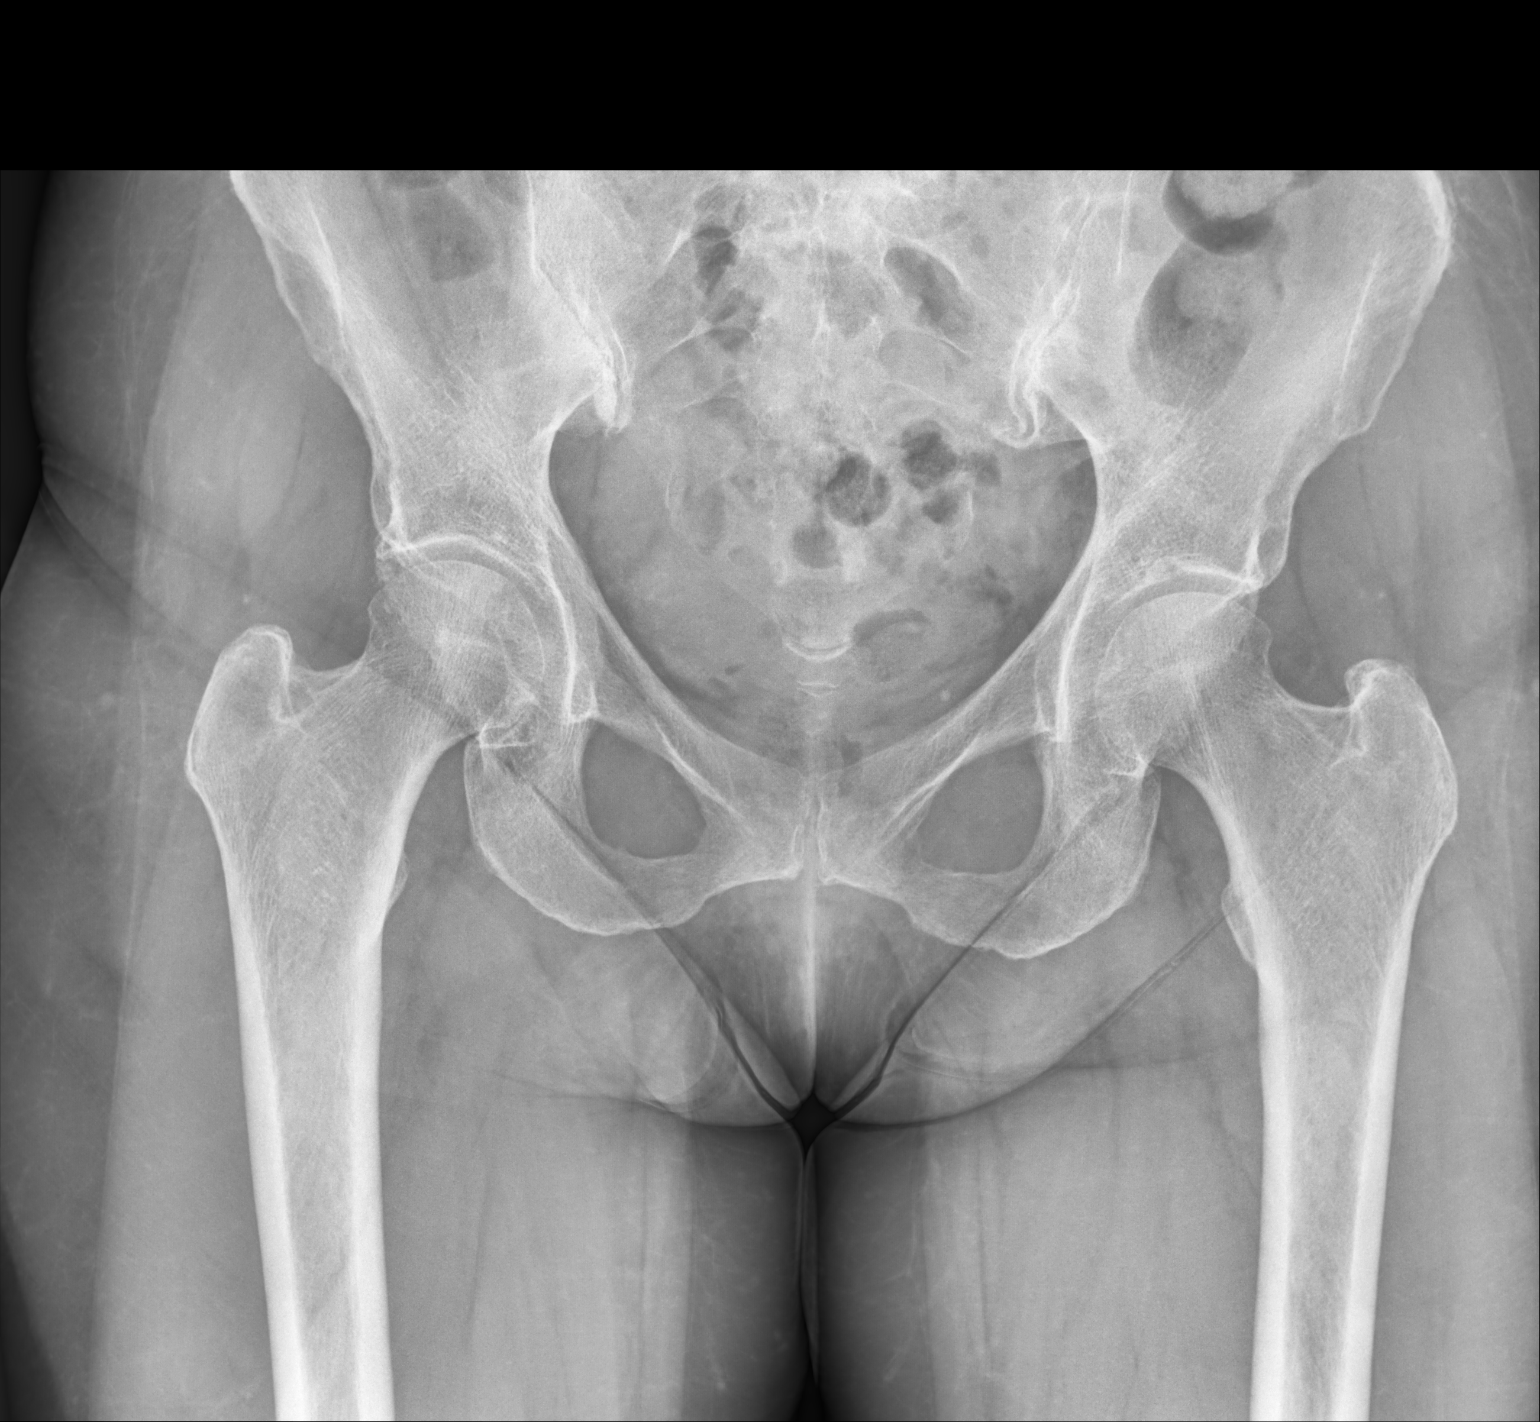

[hip joint (frog view)]
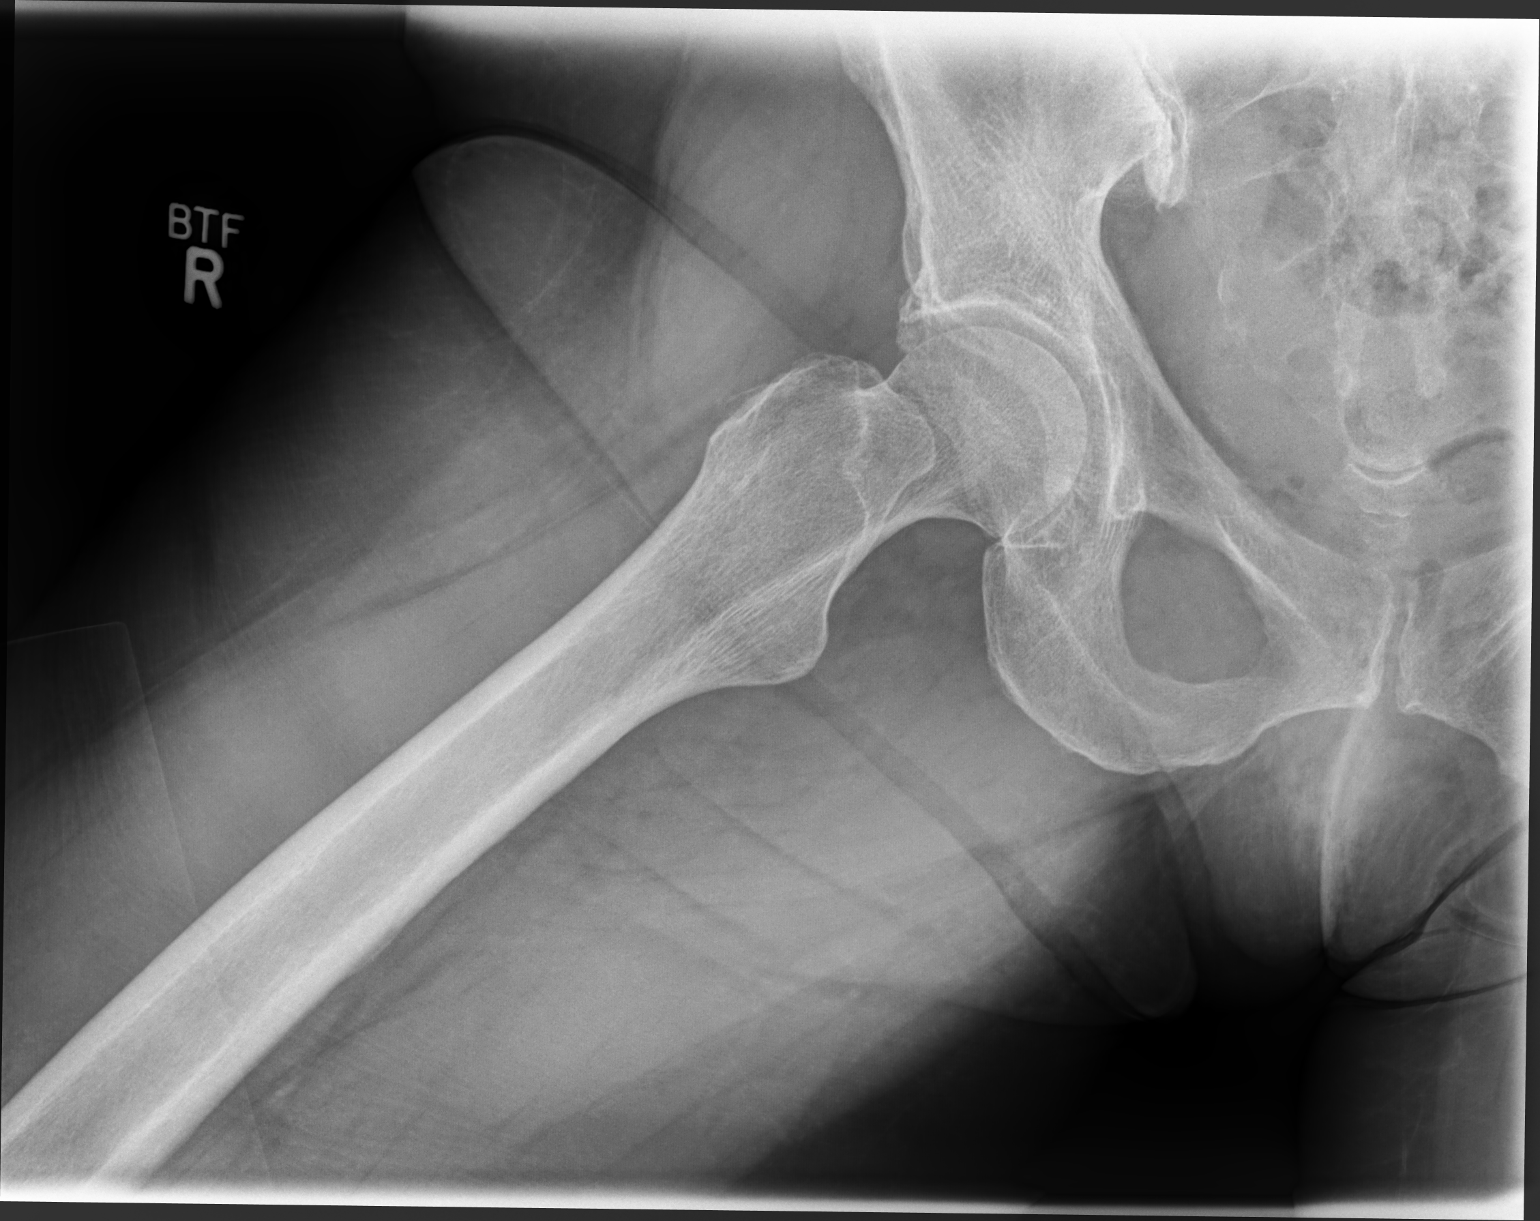

[2 of 2 positions shown; findings below may reference images not displayed]

FINDINGS: The bones are subjectively adequately mineralized. There is no lytic
nor blastic lesion pelvic lesion. There are degenerative changes of
the SI joints greatest on the right. AP and lateral views of the
right hip reveal preservation of the joint space. The articular
surfaces of the femoral head and acetabulum remain smoothly rounded.
The femoral neck, intertrochanteric, and subtrochanteric regions are
normal.
IMPRESSION: There is no acute or significant chronic bony abnormality of the
right hip. There are degenerative changes of the SI joints.

## 2018-05-26 DIAGNOSIS — M9901 Segmental and somatic dysfunction of cervical region: Secondary | ICD-10-CM | POA: Diagnosis not present

## 2018-05-26 DIAGNOSIS — M791 Myalgia, unspecified site: Secondary | ICD-10-CM | POA: Diagnosis not present

## 2018-05-26 DIAGNOSIS — M4712 Other spondylosis with myelopathy, cervical region: Secondary | ICD-10-CM | POA: Diagnosis not present

## 2018-05-26 DIAGNOSIS — M9902 Segmental and somatic dysfunction of thoracic region: Secondary | ICD-10-CM | POA: Diagnosis not present

## 2018-05-29 DIAGNOSIS — H9193 Unspecified hearing loss, bilateral: Secondary | ICD-10-CM | POA: Diagnosis not present

## 2018-05-29 DIAGNOSIS — H6123 Impacted cerumen, bilateral: Secondary | ICD-10-CM | POA: Diagnosis not present

## 2018-08-25 DIAGNOSIS — M25562 Pain in left knee: Secondary | ICD-10-CM | POA: Diagnosis not present

## 2018-08-27 DIAGNOSIS — M7122 Synovial cyst of popliteal space [Baker], left knee: Secondary | ICD-10-CM | POA: Diagnosis not present

## 2018-08-27 DIAGNOSIS — S83232A Complex tear of medial meniscus, current injury, left knee, initial encounter: Secondary | ICD-10-CM | POA: Diagnosis not present

## 2018-08-27 DIAGNOSIS — M25462 Effusion, left knee: Secondary | ICD-10-CM | POA: Diagnosis not present

## 2018-08-27 DIAGNOSIS — M2242 Chondromalacia patellae, left knee: Secondary | ICD-10-CM | POA: Diagnosis not present

## 2018-09-24 DIAGNOSIS — M1712 Unilateral primary osteoarthritis, left knee: Secondary | ICD-10-CM | POA: Diagnosis not present

## 2018-09-24 DIAGNOSIS — M25562 Pain in left knee: Secondary | ICD-10-CM | POA: Diagnosis not present

## 2018-09-29 DIAGNOSIS — M1712 Unilateral primary osteoarthritis, left knee: Secondary | ICD-10-CM | POA: Diagnosis not present

## 2018-09-29 DIAGNOSIS — M25562 Pain in left knee: Secondary | ICD-10-CM | POA: Diagnosis not present

## 2018-10-01 DIAGNOSIS — M1712 Unilateral primary osteoarthritis, left knee: Secondary | ICD-10-CM | POA: Diagnosis not present

## 2018-10-01 DIAGNOSIS — M25562 Pain in left knee: Secondary | ICD-10-CM | POA: Diagnosis not present

## 2021-03-01 ENCOUNTER — Other Ambulatory Visit: Payer: Self-pay | Admitting: Physical Medicine and Rehabilitation

## 2021-03-01 ENCOUNTER — Other Ambulatory Visit: Payer: Self-pay | Admitting: Family Medicine

## 2021-03-01 DIAGNOSIS — M545 Low back pain, unspecified: Secondary | ICD-10-CM

## 2021-03-15 ENCOUNTER — Ambulatory Visit
Admission: RE | Admit: 2021-03-15 | Discharge: 2021-03-15 | Disposition: A | Discharge status: Home / Self Care | Payer: 59 | Source: Ambulatory Visit | Attending: Physical Medicine and Rehabilitation | Admitting: Physical Medicine and Rehabilitation

## 2021-03-15 ENCOUNTER — Other Ambulatory Visit: Payer: Self-pay

## 2021-03-15 DIAGNOSIS — M545 Low back pain, unspecified: Secondary | ICD-10-CM

## 2022-08-07 DIAGNOSIS — M1712 Unilateral primary osteoarthritis, left knee: Secondary | ICD-10-CM | POA: Diagnosis not present

## 2022-08-07 DIAGNOSIS — M25662 Stiffness of left knee, not elsewhere classified: Secondary | ICD-10-CM | POA: Diagnosis not present

## 2022-08-07 DIAGNOSIS — R262 Difficulty in walking, not elsewhere classified: Secondary | ICD-10-CM | POA: Diagnosis not present

## 2022-08-07 DIAGNOSIS — M25562 Pain in left knee: Secondary | ICD-10-CM | POA: Diagnosis not present

## 2022-08-21 DIAGNOSIS — M25562 Pain in left knee: Secondary | ICD-10-CM | POA: Diagnosis not present

## 2022-08-21 DIAGNOSIS — R262 Difficulty in walking, not elsewhere classified: Secondary | ICD-10-CM | POA: Diagnosis not present

## 2022-08-21 DIAGNOSIS — M1712 Unilateral primary osteoarthritis, left knee: Secondary | ICD-10-CM | POA: Diagnosis not present

## 2022-08-21 DIAGNOSIS — M25662 Stiffness of left knee, not elsewhere classified: Secondary | ICD-10-CM | POA: Diagnosis not present

## 2022-08-28 DIAGNOSIS — M25562 Pain in left knee: Secondary | ICD-10-CM | POA: Diagnosis not present

## 2022-08-28 DIAGNOSIS — M1712 Unilateral primary osteoarthritis, left knee: Secondary | ICD-10-CM | POA: Diagnosis not present

## 2022-08-28 DIAGNOSIS — M25662 Stiffness of left knee, not elsewhere classified: Secondary | ICD-10-CM | POA: Diagnosis not present

## 2022-08-28 DIAGNOSIS — R262 Difficulty in walking, not elsewhere classified: Secondary | ICD-10-CM | POA: Diagnosis not present

## 2022-09-04 DIAGNOSIS — M25562 Pain in left knee: Secondary | ICD-10-CM | POA: Diagnosis not present

## 2022-09-04 DIAGNOSIS — R262 Difficulty in walking, not elsewhere classified: Secondary | ICD-10-CM | POA: Diagnosis not present

## 2022-09-04 DIAGNOSIS — M25662 Stiffness of left knee, not elsewhere classified: Secondary | ICD-10-CM | POA: Diagnosis not present

## 2022-09-04 DIAGNOSIS — M1712 Unilateral primary osteoarthritis, left knee: Secondary | ICD-10-CM | POA: Diagnosis not present

## 2022-09-25 DIAGNOSIS — M25662 Stiffness of left knee, not elsewhere classified: Secondary | ICD-10-CM | POA: Diagnosis not present

## 2022-09-25 DIAGNOSIS — R262 Difficulty in walking, not elsewhere classified: Secondary | ICD-10-CM | POA: Diagnosis not present

## 2022-09-25 DIAGNOSIS — M1712 Unilateral primary osteoarthritis, left knee: Secondary | ICD-10-CM | POA: Diagnosis not present

## 2022-09-25 DIAGNOSIS — M25562 Pain in left knee: Secondary | ICD-10-CM | POA: Diagnosis not present

## 2022-10-18 DIAGNOSIS — M25562 Pain in left knee: Secondary | ICD-10-CM | POA: Diagnosis not present

## 2022-10-18 DIAGNOSIS — M6281 Muscle weakness (generalized): Secondary | ICD-10-CM | POA: Diagnosis not present

## 2022-10-18 DIAGNOSIS — M25662 Stiffness of left knee, not elsewhere classified: Secondary | ICD-10-CM | POA: Diagnosis not present

## 2022-10-18 DIAGNOSIS — R293 Abnormal posture: Secondary | ICD-10-CM | POA: Diagnosis not present

## 2022-10-25 DIAGNOSIS — M25562 Pain in left knee: Secondary | ICD-10-CM | POA: Diagnosis not present

## 2022-10-25 DIAGNOSIS — R293 Abnormal posture: Secondary | ICD-10-CM | POA: Diagnosis not present

## 2022-10-25 DIAGNOSIS — M6281 Muscle weakness (generalized): Secondary | ICD-10-CM | POA: Diagnosis not present

## 2022-10-25 DIAGNOSIS — M25662 Stiffness of left knee, not elsewhere classified: Secondary | ICD-10-CM | POA: Diagnosis not present

## 2022-10-29 DIAGNOSIS — M25562 Pain in left knee: Secondary | ICD-10-CM | POA: Diagnosis not present

## 2022-10-29 DIAGNOSIS — M6281 Muscle weakness (generalized): Secondary | ICD-10-CM | POA: Diagnosis not present

## 2022-10-29 DIAGNOSIS — M25662 Stiffness of left knee, not elsewhere classified: Secondary | ICD-10-CM | POA: Diagnosis not present

## 2022-10-29 DIAGNOSIS — R293 Abnormal posture: Secondary | ICD-10-CM | POA: Diagnosis not present

## 2022-11-05 DIAGNOSIS — M25562 Pain in left knee: Secondary | ICD-10-CM | POA: Diagnosis not present

## 2022-11-05 DIAGNOSIS — M6281 Muscle weakness (generalized): Secondary | ICD-10-CM | POA: Diagnosis not present

## 2022-11-05 DIAGNOSIS — R293 Abnormal posture: Secondary | ICD-10-CM | POA: Diagnosis not present

## 2022-11-05 DIAGNOSIS — M25662 Stiffness of left knee, not elsewhere classified: Secondary | ICD-10-CM | POA: Diagnosis not present

## 2022-11-08 DIAGNOSIS — R293 Abnormal posture: Secondary | ICD-10-CM | POA: Diagnosis not present

## 2022-11-08 DIAGNOSIS — M6281 Muscle weakness (generalized): Secondary | ICD-10-CM | POA: Diagnosis not present

## 2022-11-08 DIAGNOSIS — M25562 Pain in left knee: Secondary | ICD-10-CM | POA: Diagnosis not present

## 2022-11-08 DIAGNOSIS — M25662 Stiffness of left knee, not elsewhere classified: Secondary | ICD-10-CM | POA: Diagnosis not present

## 2022-11-13 DIAGNOSIS — M25562 Pain in left knee: Secondary | ICD-10-CM | POA: Diagnosis not present

## 2022-11-13 DIAGNOSIS — M6281 Muscle weakness (generalized): Secondary | ICD-10-CM | POA: Diagnosis not present

## 2022-11-13 DIAGNOSIS — M25662 Stiffness of left knee, not elsewhere classified: Secondary | ICD-10-CM | POA: Diagnosis not present

## 2022-11-13 DIAGNOSIS — R293 Abnormal posture: Secondary | ICD-10-CM | POA: Diagnosis not present

## 2022-11-15 DIAGNOSIS — M25562 Pain in left knee: Secondary | ICD-10-CM | POA: Diagnosis not present

## 2022-11-15 DIAGNOSIS — M6281 Muscle weakness (generalized): Secondary | ICD-10-CM | POA: Diagnosis not present

## 2022-11-15 DIAGNOSIS — M25662 Stiffness of left knee, not elsewhere classified: Secondary | ICD-10-CM | POA: Diagnosis not present

## 2022-11-15 DIAGNOSIS — R293 Abnormal posture: Secondary | ICD-10-CM | POA: Diagnosis not present

## 2022-11-18 DIAGNOSIS — M25562 Pain in left knee: Secondary | ICD-10-CM | POA: Diagnosis not present

## 2022-11-18 DIAGNOSIS — R293 Abnormal posture: Secondary | ICD-10-CM | POA: Diagnosis not present

## 2022-11-18 DIAGNOSIS — M25662 Stiffness of left knee, not elsewhere classified: Secondary | ICD-10-CM | POA: Diagnosis not present

## 2022-11-18 DIAGNOSIS — M6281 Muscle weakness (generalized): Secondary | ICD-10-CM | POA: Diagnosis not present

## 2022-11-20 DIAGNOSIS — M6281 Muscle weakness (generalized): Secondary | ICD-10-CM | POA: Diagnosis not present

## 2022-11-20 DIAGNOSIS — M25662 Stiffness of left knee, not elsewhere classified: Secondary | ICD-10-CM | POA: Diagnosis not present

## 2022-11-20 DIAGNOSIS — M25562 Pain in left knee: Secondary | ICD-10-CM | POA: Diagnosis not present

## 2022-11-20 DIAGNOSIS — R293 Abnormal posture: Secondary | ICD-10-CM | POA: Diagnosis not present

## 2022-11-25 DIAGNOSIS — R293 Abnormal posture: Secondary | ICD-10-CM | POA: Diagnosis not present

## 2022-11-25 DIAGNOSIS — M25562 Pain in left knee: Secondary | ICD-10-CM | POA: Diagnosis not present

## 2022-11-25 DIAGNOSIS — M6281 Muscle weakness (generalized): Secondary | ICD-10-CM | POA: Diagnosis not present

## 2022-11-25 DIAGNOSIS — M25662 Stiffness of left knee, not elsewhere classified: Secondary | ICD-10-CM | POA: Diagnosis not present

## 2022-11-27 DIAGNOSIS — M25662 Stiffness of left knee, not elsewhere classified: Secondary | ICD-10-CM | POA: Diagnosis not present

## 2022-11-27 DIAGNOSIS — M6281 Muscle weakness (generalized): Secondary | ICD-10-CM | POA: Diagnosis not present

## 2022-11-27 DIAGNOSIS — M25562 Pain in left knee: Secondary | ICD-10-CM | POA: Diagnosis not present

## 2022-11-27 DIAGNOSIS — R293 Abnormal posture: Secondary | ICD-10-CM | POA: Diagnosis not present

## 2022-12-09 DIAGNOSIS — R293 Abnormal posture: Secondary | ICD-10-CM | POA: Diagnosis not present

## 2022-12-09 DIAGNOSIS — M25662 Stiffness of left knee, not elsewhere classified: Secondary | ICD-10-CM | POA: Diagnosis not present

## 2022-12-09 DIAGNOSIS — M25562 Pain in left knee: Secondary | ICD-10-CM | POA: Diagnosis not present

## 2022-12-09 DIAGNOSIS — M6281 Muscle weakness (generalized): Secondary | ICD-10-CM | POA: Diagnosis not present

## 2022-12-16 DIAGNOSIS — R293 Abnormal posture: Secondary | ICD-10-CM | POA: Diagnosis not present

## 2022-12-16 DIAGNOSIS — M25562 Pain in left knee: Secondary | ICD-10-CM | POA: Diagnosis not present

## 2022-12-16 DIAGNOSIS — M25662 Stiffness of left knee, not elsewhere classified: Secondary | ICD-10-CM | POA: Diagnosis not present

## 2022-12-16 DIAGNOSIS — M6281 Muscle weakness (generalized): Secondary | ICD-10-CM | POA: Diagnosis not present

## 2023-03-24 DIAGNOSIS — D3131 Benign neoplasm of right choroid: Secondary | ICD-10-CM | POA: Diagnosis not present

## 2023-03-24 DIAGNOSIS — H524 Presbyopia: Secondary | ICD-10-CM | POA: Diagnosis not present

## 2023-03-24 DIAGNOSIS — H2513 Age-related nuclear cataract, bilateral: Secondary | ICD-10-CM | POA: Diagnosis not present

## 2023-05-14 DIAGNOSIS — M25539 Pain in unspecified wrist: Secondary | ICD-10-CM | POA: Diagnosis not present

## 2023-05-14 DIAGNOSIS — M25569 Pain in unspecified knee: Secondary | ICD-10-CM | POA: Diagnosis not present

## 2023-05-14 DIAGNOSIS — M25529 Pain in unspecified elbow: Secondary | ICD-10-CM | POA: Diagnosis not present

## 2023-05-14 DIAGNOSIS — M25519 Pain in unspecified shoulder: Secondary | ICD-10-CM | POA: Diagnosis not present

## 2023-05-16 DIAGNOSIS — M25529 Pain in unspecified elbow: Secondary | ICD-10-CM | POA: Diagnosis not present

## 2023-05-16 DIAGNOSIS — M25569 Pain in unspecified knee: Secondary | ICD-10-CM | POA: Diagnosis not present

## 2023-05-16 DIAGNOSIS — M25519 Pain in unspecified shoulder: Secondary | ICD-10-CM | POA: Diagnosis not present

## 2023-05-16 DIAGNOSIS — M25539 Pain in unspecified wrist: Secondary | ICD-10-CM | POA: Diagnosis not present

## 2023-05-19 DIAGNOSIS — M25539 Pain in unspecified wrist: Secondary | ICD-10-CM | POA: Diagnosis not present

## 2023-05-19 DIAGNOSIS — M25569 Pain in unspecified knee: Secondary | ICD-10-CM | POA: Diagnosis not present

## 2023-05-19 DIAGNOSIS — M25519 Pain in unspecified shoulder: Secondary | ICD-10-CM | POA: Diagnosis not present

## 2023-05-19 DIAGNOSIS — M25529 Pain in unspecified elbow: Secondary | ICD-10-CM | POA: Diagnosis not present

## 2023-05-21 DIAGNOSIS — M25539 Pain in unspecified wrist: Secondary | ICD-10-CM | POA: Diagnosis not present

## 2023-05-21 DIAGNOSIS — M25569 Pain in unspecified knee: Secondary | ICD-10-CM | POA: Diagnosis not present

## 2023-05-21 DIAGNOSIS — M25529 Pain in unspecified elbow: Secondary | ICD-10-CM | POA: Diagnosis not present

## 2023-05-21 DIAGNOSIS — M25519 Pain in unspecified shoulder: Secondary | ICD-10-CM | POA: Diagnosis not present

## 2023-05-29 DIAGNOSIS — M25539 Pain in unspecified wrist: Secondary | ICD-10-CM | POA: Diagnosis not present

## 2023-05-29 DIAGNOSIS — M25519 Pain in unspecified shoulder: Secondary | ICD-10-CM | POA: Diagnosis not present

## 2023-05-29 DIAGNOSIS — M25529 Pain in unspecified elbow: Secondary | ICD-10-CM | POA: Diagnosis not present

## 2023-05-29 DIAGNOSIS — M25569 Pain in unspecified knee: Secondary | ICD-10-CM | POA: Diagnosis not present

## 2023-06-05 DIAGNOSIS — M25539 Pain in unspecified wrist: Secondary | ICD-10-CM | POA: Diagnosis not present

## 2023-06-05 DIAGNOSIS — M25569 Pain in unspecified knee: Secondary | ICD-10-CM | POA: Diagnosis not present

## 2023-06-05 DIAGNOSIS — M25519 Pain in unspecified shoulder: Secondary | ICD-10-CM | POA: Diagnosis not present

## 2023-06-05 DIAGNOSIS — M25529 Pain in unspecified elbow: Secondary | ICD-10-CM | POA: Diagnosis not present

## 2023-06-10 DIAGNOSIS — R739 Hyperglycemia, unspecified: Secondary | ICD-10-CM | POA: Diagnosis not present

## 2023-06-10 DIAGNOSIS — E559 Vitamin D deficiency, unspecified: Secondary | ICD-10-CM | POA: Diagnosis not present

## 2023-06-10 DIAGNOSIS — R7989 Other specified abnormal findings of blood chemistry: Secondary | ICD-10-CM | POA: Diagnosis not present

## 2023-06-10 DIAGNOSIS — M810 Age-related osteoporosis without current pathological fracture: Secondary | ICD-10-CM | POA: Diagnosis not present

## 2023-06-14 DIAGNOSIS — M25569 Pain in unspecified knee: Secondary | ICD-10-CM | POA: Diagnosis not present

## 2023-06-14 DIAGNOSIS — M25529 Pain in unspecified elbow: Secondary | ICD-10-CM | POA: Diagnosis not present

## 2023-06-14 DIAGNOSIS — M25539 Pain in unspecified wrist: Secondary | ICD-10-CM | POA: Diagnosis not present

## 2023-06-14 DIAGNOSIS — M25519 Pain in unspecified shoulder: Secondary | ICD-10-CM | POA: Diagnosis not present

## 2023-06-17 DIAGNOSIS — E559 Vitamin D deficiency, unspecified: Secondary | ICD-10-CM | POA: Diagnosis not present

## 2023-06-17 DIAGNOSIS — R82998 Other abnormal findings in urine: Secondary | ICD-10-CM | POA: Diagnosis not present

## 2023-06-17 DIAGNOSIS — N3941 Urge incontinence: Secondary | ICD-10-CM | POA: Diagnosis not present

## 2023-06-17 DIAGNOSIS — I493 Ventricular premature depolarization: Secondary | ICD-10-CM | POA: Diagnosis not present

## 2023-06-17 DIAGNOSIS — R351 Nocturia: Secondary | ICD-10-CM | POA: Diagnosis not present

## 2023-06-17 DIAGNOSIS — M1990 Unspecified osteoarthritis, unspecified site: Secondary | ICD-10-CM | POA: Diagnosis not present

## 2023-06-17 DIAGNOSIS — G4762 Sleep related leg cramps: Secondary | ICD-10-CM | POA: Diagnosis not present

## 2023-06-17 DIAGNOSIS — Z0000 Encounter for general adult medical examination without abnormal findings: Secondary | ICD-10-CM | POA: Diagnosis not present

## 2023-06-17 DIAGNOSIS — M810 Age-related osteoporosis without current pathological fracture: Secondary | ICD-10-CM | POA: Diagnosis not present

## 2023-06-17 DIAGNOSIS — R3589 Other polyuria: Secondary | ICD-10-CM | POA: Diagnosis not present

## 2023-06-17 DIAGNOSIS — G4700 Insomnia, unspecified: Secondary | ICD-10-CM | POA: Diagnosis not present

## 2023-06-17 DIAGNOSIS — R7989 Other specified abnormal findings of blood chemistry: Secondary | ICD-10-CM | POA: Diagnosis not present

## 2023-06-17 DIAGNOSIS — L570 Actinic keratosis: Secondary | ICD-10-CM | POA: Diagnosis not present

## 2023-06-17 DIAGNOSIS — R531 Weakness: Secondary | ICD-10-CM | POA: Diagnosis not present

## 2023-06-17 DIAGNOSIS — R739 Hyperglycemia, unspecified: Secondary | ICD-10-CM | POA: Diagnosis not present

## 2023-06-17 DIAGNOSIS — Z1331 Encounter for screening for depression: Secondary | ICD-10-CM | POA: Diagnosis not present

## 2023-06-30 DIAGNOSIS — L853 Xerosis cutis: Secondary | ICD-10-CM | POA: Diagnosis not present

## 2023-06-30 DIAGNOSIS — L723 Sebaceous cyst: Secondary | ICD-10-CM | POA: Diagnosis not present

## 2023-06-30 DIAGNOSIS — D485 Neoplasm of uncertain behavior of skin: Secondary | ICD-10-CM | POA: Diagnosis not present

## 2023-06-30 DIAGNOSIS — D045 Carcinoma in situ of skin of trunk: Secondary | ICD-10-CM | POA: Diagnosis not present

## 2023-07-09 DIAGNOSIS — M21611 Bunion of right foot: Secondary | ICD-10-CM | POA: Diagnosis not present

## 2023-07-09 DIAGNOSIS — M79671 Pain in right foot: Secondary | ICD-10-CM | POA: Diagnosis not present

## 2023-07-09 DIAGNOSIS — M79672 Pain in left foot: Secondary | ICD-10-CM | POA: Diagnosis not present

## 2023-09-03 DIAGNOSIS — M79641 Pain in right hand: Secondary | ICD-10-CM | POA: Diagnosis not present

## 2023-09-03 DIAGNOSIS — D219 Benign neoplasm of connective and other soft tissue, unspecified: Secondary | ICD-10-CM | POA: Diagnosis not present

## 2023-09-03 DIAGNOSIS — M720 Palmar fascial fibromatosis [Dupuytren]: Secondary | ICD-10-CM | POA: Diagnosis not present

## 2023-11-18 DIAGNOSIS — Z85828 Personal history of other malignant neoplasm of skin: Secondary | ICD-10-CM | POA: Diagnosis not present

## 2023-11-18 DIAGNOSIS — L821 Other seborrheic keratosis: Secondary | ICD-10-CM | POA: Diagnosis not present

## 2024-03-25 DIAGNOSIS — H2513 Age-related nuclear cataract, bilateral: Secondary | ICD-10-CM | POA: Diagnosis not present

## 2024-03-25 DIAGNOSIS — H524 Presbyopia: Secondary | ICD-10-CM | POA: Diagnosis not present

## 2024-03-25 DIAGNOSIS — D3131 Benign neoplasm of right choroid: Secondary | ICD-10-CM | POA: Diagnosis not present

## 2024-07-27 ENCOUNTER — Other Ambulatory Visit: Payer: Self-pay | Admitting: Internal Medicine

## 2024-07-27 DIAGNOSIS — Z0000 Encounter for general adult medical examination without abnormal findings: Secondary | ICD-10-CM

## 2024-08-25 ENCOUNTER — Other Ambulatory Visit

## 2024-11-17 ENCOUNTER — Other Ambulatory Visit
